# Patient Record
Sex: Female | Born: 1965 | Race: White | Hispanic: No | Marital: Married | Smoking: Former smoker
Health system: Southern US, Community
[De-identification: ages and names within clinical notes are randomized; demographics above are authoritative.]

## PROBLEM LIST (undated history)

## (undated) DIAGNOSIS — E559 Vitamin D deficiency, unspecified: Secondary | ICD-10-CM

## (undated) DIAGNOSIS — R7303 Prediabetes: Secondary | ICD-10-CM

## (undated) DIAGNOSIS — R252 Cramp and spasm: Secondary | ICD-10-CM

## (undated) DIAGNOSIS — K760 Fatty (change of) liver, not elsewhere classified: Secondary | ICD-10-CM

## (undated) DIAGNOSIS — E785 Hyperlipidemia, unspecified: Secondary | ICD-10-CM

## (undated) DIAGNOSIS — R7309 Other abnormal glucose: Secondary | ICD-10-CM

## (undated) DIAGNOSIS — H9190 Unspecified hearing loss, unspecified ear: Secondary | ICD-10-CM

## (undated) HISTORY — DX: Vitamin D deficiency, unspecified: E55.9

## (undated) HISTORY — DX: Unspecified hearing loss, unspecified ear: H91.90

## (undated) HISTORY — DX: Fatty (change of) liver, not elsewhere classified: K76.0

## (undated) HISTORY — PX: TUBAL LIGATION: SHX77

## (undated) HISTORY — DX: Cramp and spasm: R25.2

## (undated) HISTORY — DX: Prediabetes: R73.03

## (undated) HISTORY — DX: Other abnormal glucose: R73.09

## (undated) HISTORY — DX: Hyperlipidemia, unspecified: E78.5

---

## 2014-10-21 ENCOUNTER — Ambulatory Visit: Payer: Self-pay | Admitting: Physician Assistant

## 2014-11-10 ENCOUNTER — Ambulatory Visit: Payer: Self-pay | Admitting: Physician Assistant

## 2015-02-07 ENCOUNTER — Ambulatory Visit: Payer: Self-pay | Admitting: Gastroenterology

## 2015-04-04 LAB — SURGICAL PATHOLOGY

## 2015-05-16 IMAGING — MG MM DIGITAL SCREENING BILAT W/ CAD
4 series · 4 of 4 positions shown · non-contrast
Comparison: None.

CLINICAL DATA: Screening.

EXAM:
DIGITAL SCREENING BILATERAL MAMMOGRAM WITH CAD

[R MLO]
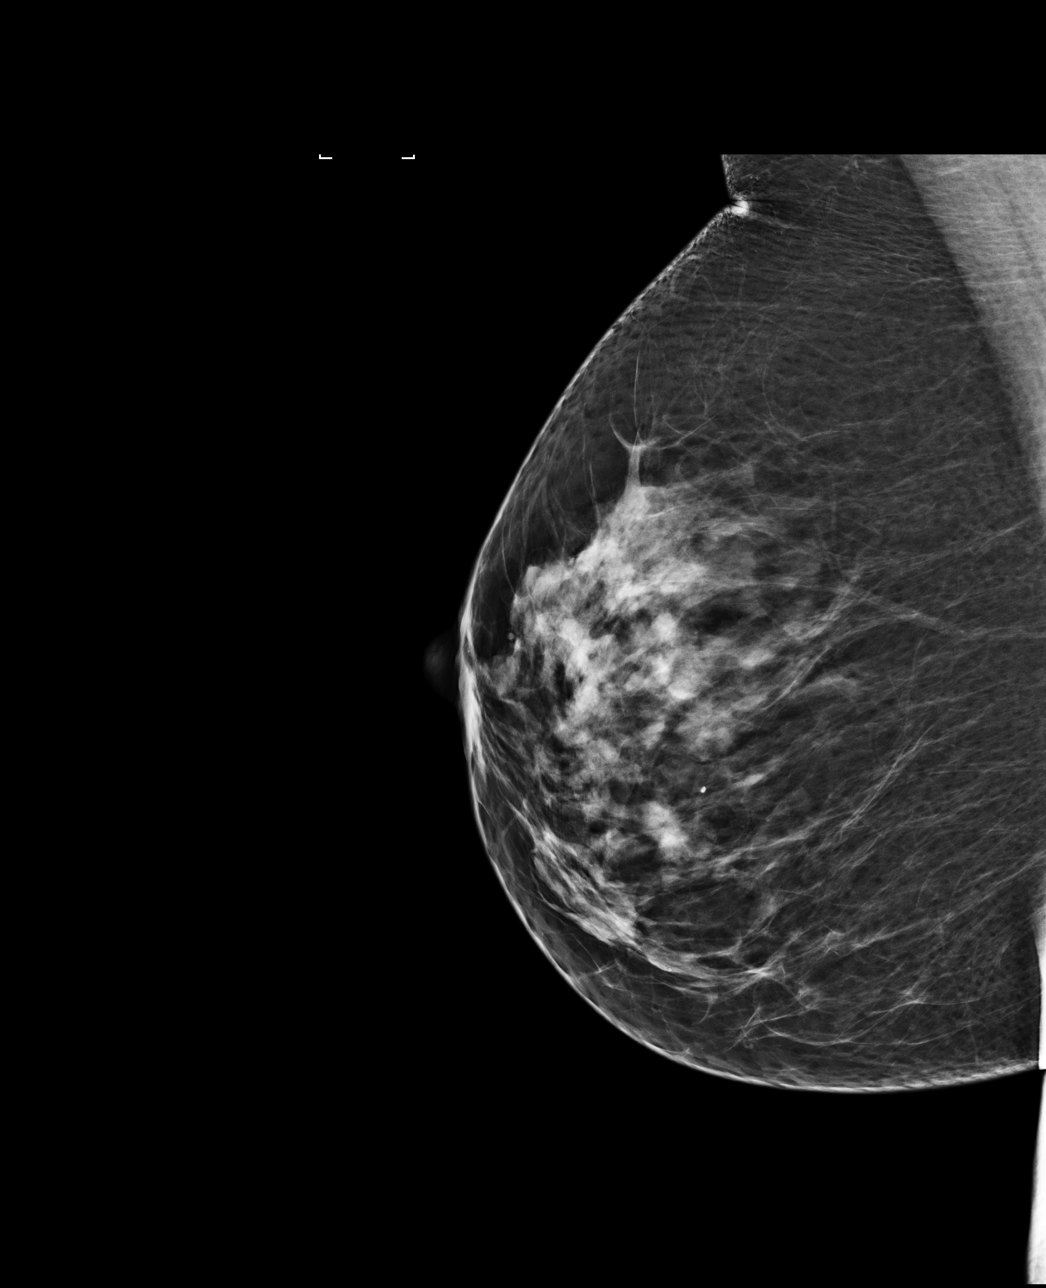

[L MLO]
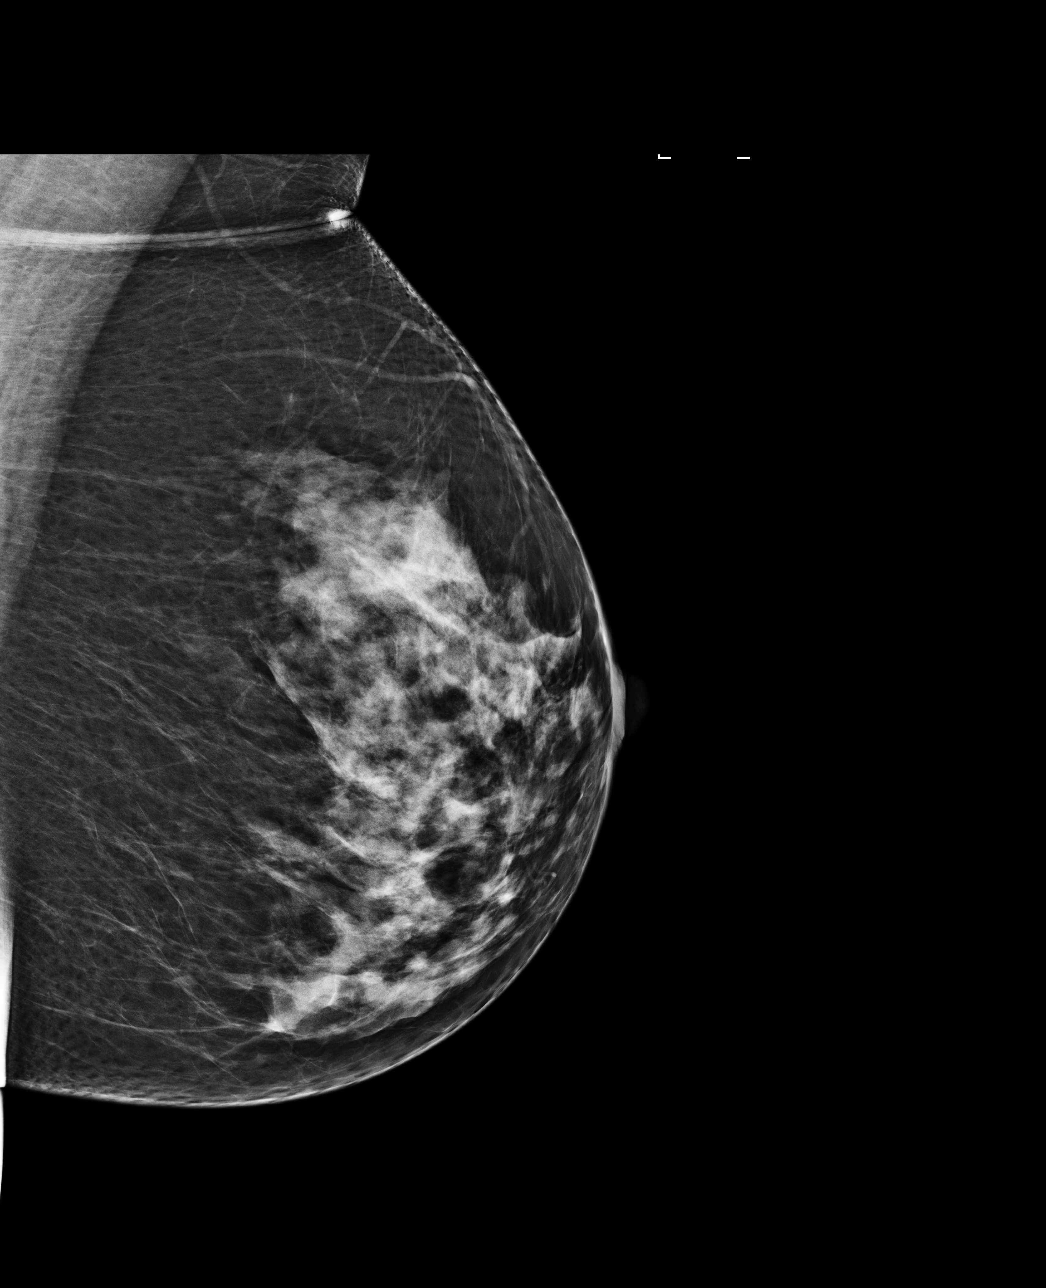

[R CC]
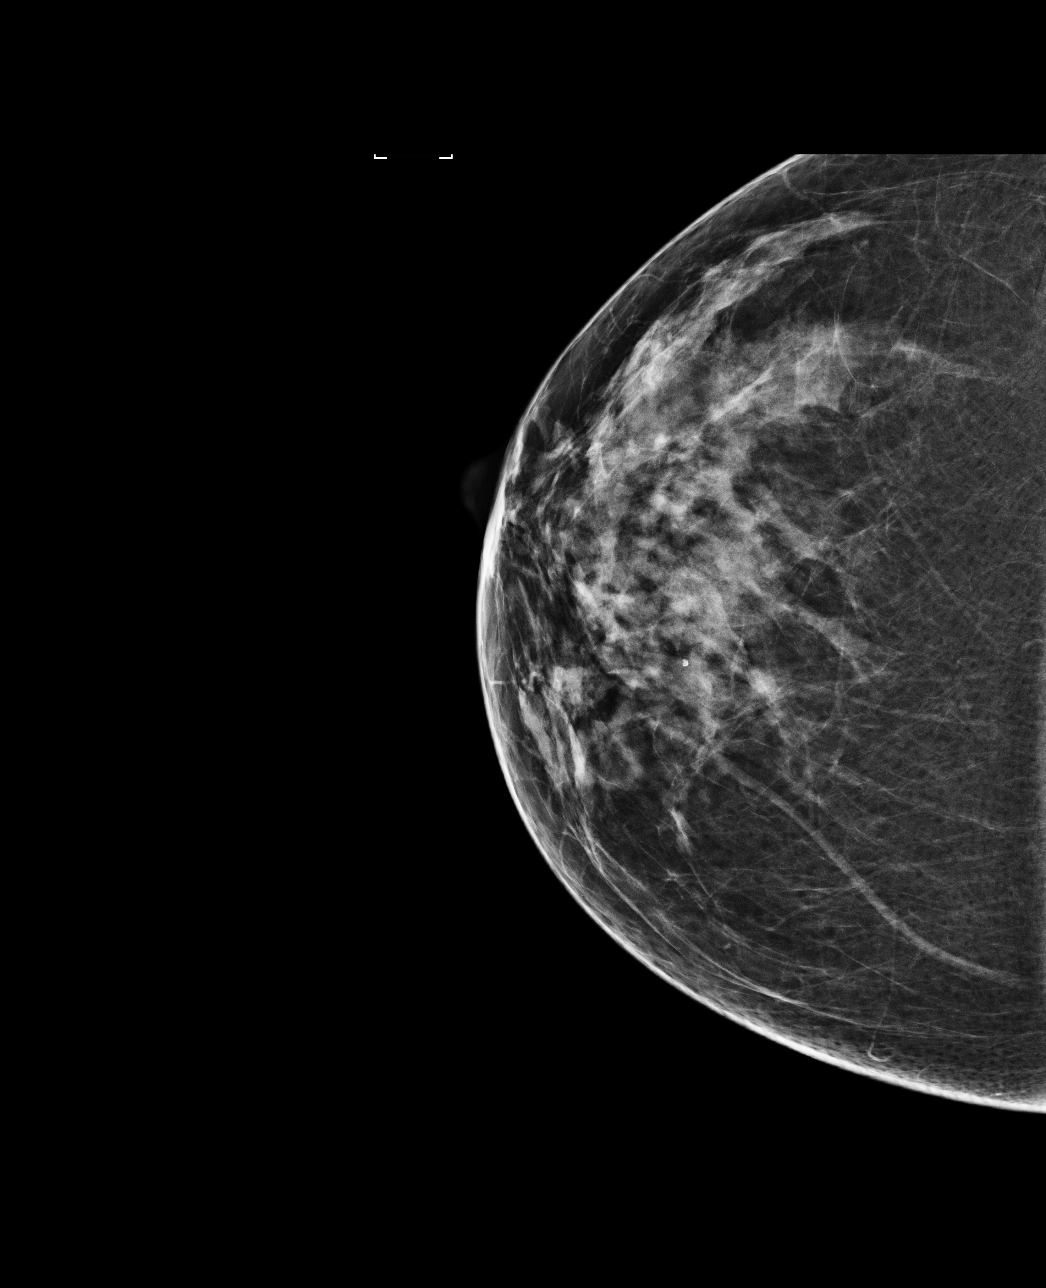

[L CC]
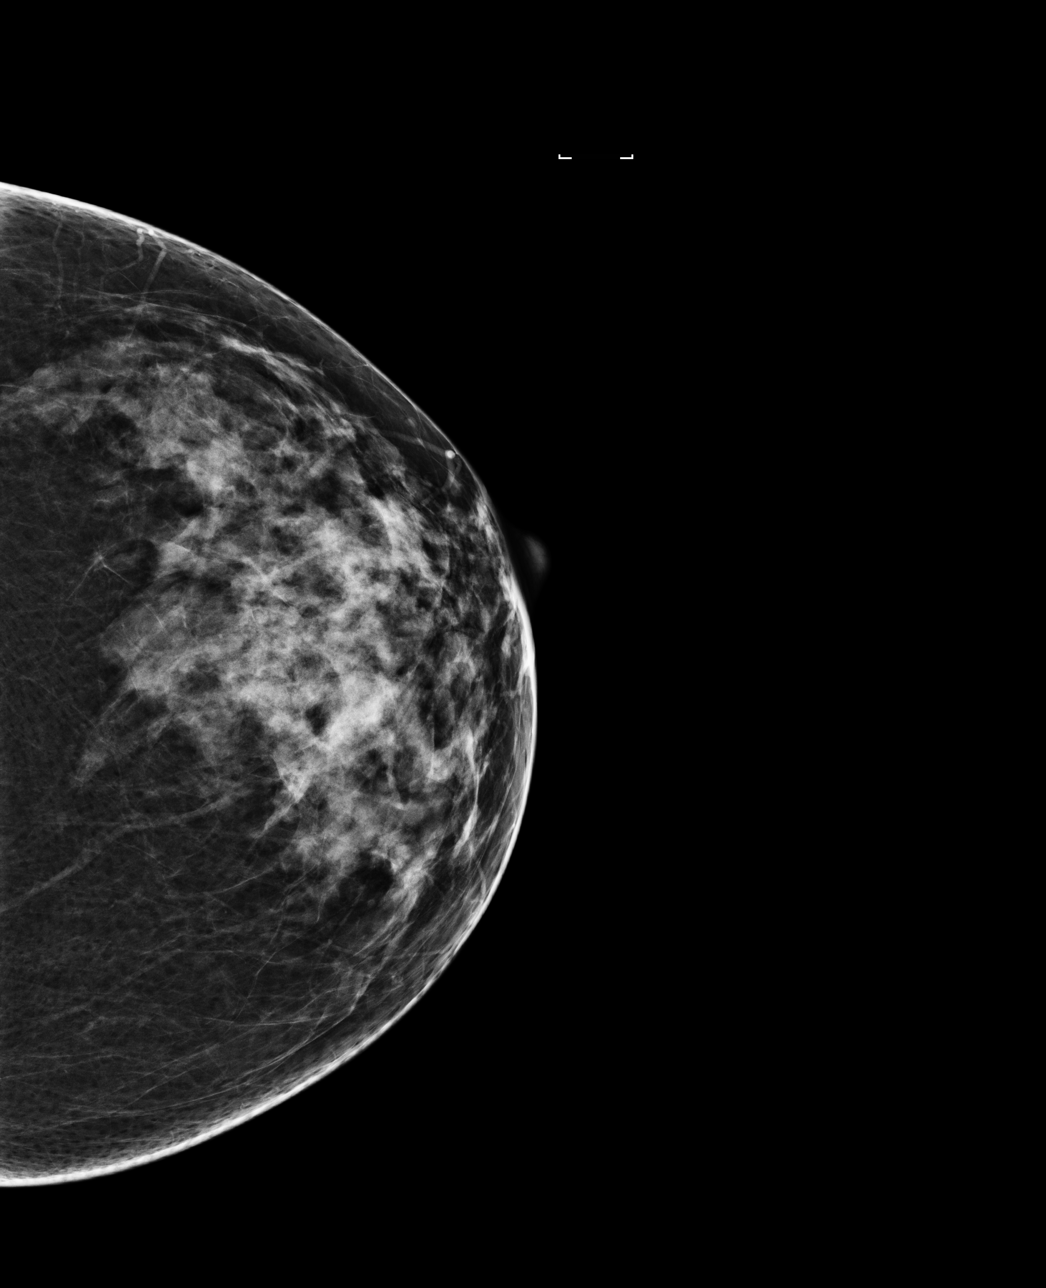

[4 of 4 positions shown; findings below may reference images not displayed]

ACR Breast Density Category c: The breast tissue is heterogeneously
dense, which may obscure small masses
FINDINGS: There are no findings suspicious for malignancy. Images were
processed with CAD.
IMPRESSION: No mammographic evidence of malignancy. A result letter of this
screening mammogram will be mailed directly to the patient.

RECOMMENDATION:
Screening mammogram in one year. (Code:[id number])

BI-RADS CATEGORY  1: Negative.

## 2016-01-18 ENCOUNTER — Other Ambulatory Visit: Payer: Self-pay | Admitting: Physician Assistant

## 2016-01-18 DIAGNOSIS — Z1231 Encounter for screening mammogram for malignant neoplasm of breast: Secondary | ICD-10-CM

## 2016-01-25 ENCOUNTER — Encounter: Payer: Self-pay | Admitting: Dietician

## 2016-01-25 ENCOUNTER — Encounter: Payer: 59 | Attending: Physician Assistant | Admitting: Dietician

## 2016-01-25 VITALS — Ht 67.0 in | Wt 207.4 lb

## 2016-01-25 DIAGNOSIS — E119 Type 2 diabetes mellitus without complications: Secondary | ICD-10-CM | POA: Insufficient documentation

## 2016-01-25 NOTE — Progress Notes (Signed)
Diabetes Self-Management Education  Visit Type: First/Initial  Appt. Start Time: 8:15  End Time: 9:15am  01/25/2016  [name], identified by name and date of birth, is a 50 y.o. female with a diagnosis of Diabetes: Type 2.   ASSESSMENT  Height  (1.702 m), weight 207 lb 6.4 oz (94.076 kg). Body mass index is 32.48 kg/(m^2).      Diabetes Self-Management Education - 01/25/16 0936    Visit Information   Visit Type First/Initial   Initial Visit   Diabetes Type Type 2   Are you currently following a meal plan? No   Are you taking your medications as prescribed? No  Is not taking statin medication as prescribed; is not on medication for diabetes   Health Coping   How would you rate your overall health? Good   Psychosocial Assessment   Patient Belief/Attitude about Diabetes Motivated to manage diabetes  "not good"   Self-care barriers None   Other persons present Patient   Patient Concerns Glycemic Control;Weight Control;Healthy Lifestyle   Special Needs None   Preferred Learning Style Hands on   Learning Readiness Ready   How often do you need to have someone help you when you read instructions, pamphlets, or other written materials from your doctor or pharmacy? 1 - Never   What is the last grade level you completed in school? high school   Complications   Last HgB A1C per patient/outside source 6.6 %   How often do you check your blood sugar? 0 times/day (not testing)  Gave and instructed on One touch meter;  134 (8:45am , 1 hr pp)   Have you had a dilated eye exam in the past 12 months? No   Have you had a dental exam in the past 12 months? Yes   Are you checking your feet? No   Dietary Intake   Breakfast --  Is drinking protein shake -recent change; previously skipping breakfast; eats lunch and dinner   Snack (morning) --  Eats 3-4 snacks per day   Exercise   Exercise Type ADL's   Patient Education   Previous Diabetes Education Yes (please comment)  had  education for gestational diabetes   Disease state  Definition of diabetes, type 1 and 2, and the diagnosis of diabetes   Nutrition management  Role of diet in the treatment of diabetes and the relationship between the three main macronutrients and blood glucose level   Physical activity and exercise  Role of exercise on diabetes management, blood pressure control and cardiac health.   Monitoring Taught/evaluated SMBG meter.;Taught/discussed recording of test results and interpretation of SMBG.;Identified appropriate SMBG and/or A1C goals.;Purpose and frequency of SMBG.   Personal strategies to promote health Lifestyle issues that need to be addressed for better diabetes care   Individualized Goals (developed by patient)   Reducing Risk Other (comment)  Improve blood sugars, lose weight, lead a healthier lifestyle      Individualized Plan for Diabetes Self-Management Training:   Learning Objective:  Patient will have a greater understanding of diabetes self-management. Patient education plan is to attend individual and/or group sessions per assessed needs and concerns.   Plan:   Patient Instructions  Check blood sugars 2 x day before breakfast and 2 hrs after supper every day  Eat 3 meals day,   2-3  snacks a day  Space meals 4-6 hours apart  Bring blood sugar records to the next appointment/class  Call your doctor for a prescription for:  1. Meter strips (type)  One Touch mini checking  2 times per day  2. Lancets (type)  One Touch Delica checking  2   times per day   Return for appointment/classes on:  01/30/2016 5:30 class      02/06/2016      02/13/2016    Education material provided: General Nutrition Guidelines for Diabetes; Simple meal planning, One touch meter If problems or questions, patient to contact team via:  Darrel Reach, RD,CDE at [phone] Future DSME appointment:  01/30/16 5:30pm

## 2016-01-25 NOTE — Patient Instructions (Signed)
Check blood sugars 2 x day before breakfast and 2 hrs after supper every day  Eat 3 meals day,   2-3  snacks a day  Space meals 4-6 hours apart  Bring blood sugar records to the next appointment/class  Call your doctor for a prescription for:  1. Meter strips (type)  One Touch mini checking  2 times per day  2. Lancets (type)  One Touch Delica checking  2   times per day   Return for appointment/classes on:  01/30/2016 5:30 class      02/06/2016      02/13/2016

## 2016-01-27 ENCOUNTER — Ambulatory Visit: Payer: Self-pay

## 2016-01-30 ENCOUNTER — Encounter: Payer: Self-pay | Admitting: Dietician

## 2016-01-30 ENCOUNTER — Encounter: Payer: 59 | Admitting: Dietician

## 2016-01-30 VITALS — Ht 67.0 in | Wt 204.9 lb

## 2016-01-30 DIAGNOSIS — E119 Type 2 diabetes mellitus without complications: Secondary | ICD-10-CM | POA: Diagnosis not present

## 2016-01-30 NOTE — Progress Notes (Signed)

## 2016-02-06 ENCOUNTER — Encounter: Payer: 59 | Admitting: Dietician

## 2016-02-06 VITALS — Wt 203.4 lb

## 2016-02-06 DIAGNOSIS — E119 Type 2 diabetes mellitus without complications: Secondary | ICD-10-CM | POA: Diagnosis not present

## 2016-02-06 NOTE — Progress Notes (Signed)

## 2016-02-08 ENCOUNTER — Ambulatory Visit
Admission: RE | Admit: 2016-02-08 | Discharge: 2016-02-08 | Disposition: A | Discharge status: Home / Self Care | Payer: 59 | Source: Ambulatory Visit | Attending: Physician Assistant | Admitting: Physician Assistant

## 2016-02-08 DIAGNOSIS — Z1231 Encounter for screening mammogram for malignant neoplasm of breast: Secondary | ICD-10-CM

## 2016-02-13 ENCOUNTER — Encounter: Payer: 59 | Attending: Physician Assistant | Admitting: Dietician

## 2016-02-13 ENCOUNTER — Encounter: Payer: Self-pay | Admitting: Dietician

## 2016-02-13 VITALS — BP 110/76 | Ht 67.0 in | Wt 201.7 lb

## 2016-02-13 DIAGNOSIS — E119 Type 2 diabetes mellitus without complications: Secondary | ICD-10-CM | POA: Insufficient documentation

## 2016-02-13 NOTE — Progress Notes (Signed)

## 2016-02-28 ENCOUNTER — Encounter: Payer: Self-pay | Admitting: Dietician

## 2017-01-23 ENCOUNTER — Other Ambulatory Visit: Payer: Self-pay | Admitting: Physician Assistant

## 2017-01-23 DIAGNOSIS — R7989 Other specified abnormal findings of blood chemistry: Secondary | ICD-10-CM | POA: Diagnosis not present

## 2017-01-23 DIAGNOSIS — E119 Type 2 diabetes mellitus without complications: Secondary | ICD-10-CM | POA: Diagnosis not present

## 2017-01-23 DIAGNOSIS — Z0000 Encounter for general adult medical examination without abnormal findings: Secondary | ICD-10-CM | POA: Diagnosis not present

## 2017-01-23 DIAGNOSIS — Z1231 Encounter for screening mammogram for malignant neoplasm of breast: Secondary | ICD-10-CM

## 2017-02-14 DIAGNOSIS — H903 Sensorineural hearing loss, bilateral: Secondary | ICD-10-CM | POA: Diagnosis not present

## 2017-02-26 ENCOUNTER — Ambulatory Visit
Admission: RE | Admit: 2017-02-26 | Discharge: 2017-02-26 | Disposition: A | Discharge status: Home / Self Care | Payer: 59 | Source: Ambulatory Visit | Attending: Physician Assistant | Admitting: Physician Assistant

## 2017-02-26 DIAGNOSIS — Z1231 Encounter for screening mammogram for malignant neoplasm of breast: Secondary | ICD-10-CM | POA: Diagnosis not present

## 2017-07-11 DIAGNOSIS — E119 Type 2 diabetes mellitus without complications: Secondary | ICD-10-CM | POA: Diagnosis not present

## 2017-07-11 DIAGNOSIS — E782 Mixed hyperlipidemia: Secondary | ICD-10-CM | POA: Diagnosis not present

## 2017-07-24 DIAGNOSIS — H1089 Other conjunctivitis: Secondary | ICD-10-CM | POA: Diagnosis not present

## 2017-11-27 DIAGNOSIS — J069 Acute upper respiratory infection, unspecified: Secondary | ICD-10-CM | POA: Diagnosis not present

## 2017-12-16 DIAGNOSIS — J019 Acute sinusitis, unspecified: Secondary | ICD-10-CM | POA: Diagnosis not present

## 2018-02-03 ENCOUNTER — Other Ambulatory Visit: Payer: Self-pay | Admitting: Physician Assistant

## 2018-02-03 DIAGNOSIS — Z0000 Encounter for general adult medical examination without abnormal findings: Secondary | ICD-10-CM | POA: Diagnosis not present

## 2018-02-03 DIAGNOSIS — E119 Type 2 diabetes mellitus without complications: Secondary | ICD-10-CM | POA: Diagnosis not present

## 2018-02-03 DIAGNOSIS — E782 Mixed hyperlipidemia: Secondary | ICD-10-CM | POA: Diagnosis not present

## 2018-02-03 DIAGNOSIS — Z1231 Encounter for screening mammogram for malignant neoplasm of breast: Secondary | ICD-10-CM

## 2018-02-27 ENCOUNTER — Ambulatory Visit
Admission: RE | Admit: 2018-02-27 | Discharge: 2018-02-27 | Disposition: A | Discharge status: Home / Self Care | Payer: 59 | Source: Ambulatory Visit | Attending: Physician Assistant | Admitting: Physician Assistant

## 2018-02-27 DIAGNOSIS — Z1231 Encounter for screening mammogram for malignant neoplasm of breast: Secondary | ICD-10-CM

## 2018-08-04 DIAGNOSIS — E782 Mixed hyperlipidemia: Secondary | ICD-10-CM | POA: Diagnosis not present

## 2018-08-04 DIAGNOSIS — E119 Type 2 diabetes mellitus without complications: Secondary | ICD-10-CM | POA: Diagnosis not present

## 2018-10-06 DIAGNOSIS — Z23 Encounter for immunization: Secondary | ICD-10-CM | POA: Diagnosis not present

## 2019-02-02 ENCOUNTER — Other Ambulatory Visit: Payer: Self-pay | Admitting: Physician Assistant

## 2019-02-02 DIAGNOSIS — Z1231 Encounter for screening mammogram for malignant neoplasm of breast: Secondary | ICD-10-CM

## 2019-02-05 ENCOUNTER — Encounter (INDEPENDENT_AMBULATORY_CARE_PROVIDER_SITE_OTHER): Payer: Self-pay

## 2019-02-12 ENCOUNTER — Ambulatory Visit (INDEPENDENT_AMBULATORY_CARE_PROVIDER_SITE_OTHER): Payer: 59 | Admitting: Family Medicine

## 2019-02-12 ENCOUNTER — Encounter (INDEPENDENT_AMBULATORY_CARE_PROVIDER_SITE_OTHER): Payer: Self-pay | Admitting: Family Medicine

## 2019-02-12 VITALS — BP 116/76 | HR 84 | Temp 98.1°F | Ht 67.0 in | Wt 213.0 lb

## 2019-02-12 DIAGNOSIS — R0602 Shortness of breath: Secondary | ICD-10-CM

## 2019-02-12 DIAGNOSIS — Z0289 Encounter for other administrative examinations: Secondary | ICD-10-CM

## 2019-02-12 DIAGNOSIS — Z1331 Encounter for screening for depression: Secondary | ICD-10-CM | POA: Diagnosis not present

## 2019-02-12 DIAGNOSIS — E7849 Other hyperlipidemia: Secondary | ICD-10-CM

## 2019-02-12 DIAGNOSIS — Z9189 Other specified personal risk factors, not elsewhere classified: Secondary | ICD-10-CM

## 2019-02-12 DIAGNOSIS — Z6833 Body mass index (BMI) 33.0-33.9, adult: Secondary | ICD-10-CM

## 2019-02-12 DIAGNOSIS — E559 Vitamin D deficiency, unspecified: Secondary | ICD-10-CM

## 2019-02-12 DIAGNOSIS — R5383 Other fatigue: Secondary | ICD-10-CM | POA: Diagnosis not present

## 2019-02-12 DIAGNOSIS — E1165 Type 2 diabetes mellitus with hyperglycemia: Secondary | ICD-10-CM | POA: Diagnosis not present

## 2019-02-12 DIAGNOSIS — E669 Obesity, unspecified: Secondary | ICD-10-CM

## 2019-02-14 NOTE — Progress Notes (Signed)
.  Office: [phone]  /  Fax: [phone]   HPI:   Chief Complaint: OBESITY  Jamie Riley (MR# [id number]) is a 53 y.o. female who presents on 02/12/2019 for obesity evaluation and treatment. Current BMI is Body mass index is 33.36 kg/m.Marland Kitchen Jamie Riley has struggled with obesity for years and has been unsuccessful in either losing weight or maintaining long term weight loss. Jamie Riley attended our information session and states she is currently in the action stage of change and ready to dedicate time achieving and maintaining a healthier weight.   Jamie Riley heard about our clinic in from her co-workers.  Jamie Riley states her family eats meals together she thinks her family will eat healthier with  her her desired weight loss is 53 lbs she started gaining weight 5 years ago her heaviest weight ever was 218 lbs she snacks frequently in the evenings she skips meals frequently she frequently eats larger portions than normal  she struggles with emotional eating    Fatigue Jamie Riley feels her energy is lower than it should be. This has worsened with weight gain and has not worsened recently. Jamie Riley admits to daytime somnolence and  admits to waking up still tired. Patient is at risk for obstructive sleep apnea. Patent has a history of symptoms of daytime fatigue. Patient generally gets 5 hours of sleep per night, and states they generally have generally restful sleep. Snoring is present. Apneic episodes are not present. Epworth Sleepiness Score is 3.  Dyspnea on exertion Jamie Riley notes increasing shortness of breath with exercising and seems to be worsening over time with weight gain. She notes getting out of breath sooner with activity than she used to. This has not gotten worse recently. EKG-normal sinus rhythm at 83 BPM. Jamie Riley denies orthopnea.  Diabetes II with Hyperglycemia Jamie Riley has a diagnosis of diabetes type II. Jamie Riley's Hgb A1c was 6.6 last week. She is not on medications and denies hypoglycemia. She has been  working on intensive lifestyle modifications including diet, exercise, and weight loss to help control her blood glucose levels.  Hyperlipidemia Jamie Riley has hyperlipidemia and has been trying to improve her cholesterol levels with intensive lifestyle modification including a low saturated fat diet, exercise and weight loss. Her LDL is elevated and she was recently prescribed Lipitor. She denies any chest pain, claudication or myalgias.  Vitamin D Deficiency Jamie Riley has a diagnosis of vitamin D deficiency. She is not on OTC Vit D, just started on 50,000 IU weekly (not started yet). She denies nausea, vomiting or muscle weakness.  At risk for osteopenia and osteoporosis Jamie Riley is at higher risk of osteopenia and osteoporosis due to vitamin D deficiency.   Depression Screen Jamie Riley's Food and Mood (modified PHQ-9) score was  Depression screen PHQ 2/9 02/12/2019  Decreased Interest 1  Down, Depressed, Hopeless 0  PHQ - 2 Score 1  Altered sleeping 3  Tired, decreased energy 3  Change in appetite 0  Feeling bad or failure about yourself  0  Trouble concentrating 0  Moving slowly or fidgety/restless 0  Suicidal thoughts 0  PHQ-9 Score 7  Difficult doing work/chores Not difficult at all    ASSESSMENT AND PLAN:  Other fatigue - Plan: EKG 12-Lead, Vitamin B12, Folate  Shortness of breath on exertion  Type 2 diabetes mellitus with hyperglycemia, without long-term current use of insulin (HCC) - Plan: Insulin, random  Other hyperlipidemia  Vitamin D deficiency  Depression screening  At risk for osteoporosis  Class 1 obesity with serious comorbidity and body mass  index (BMI) of 33.0 to 33.9 in adult, unspecified obesity type  PLAN:  Fatigue Jamie Riley was informed that her fatigue may be related to obesity, depression or many other causes. Labs will be ordered, and in the meanwhile Jamie Riley has agreed to work on diet, exercise and weight loss to help with fatigue. Proper sleep hygiene was discussed  including the need for 7-8 hours of quality sleep each night. A sleep study was not ordered based on symptoms and Epworth score.  Dyspnea on exertion Jamie Riley's shortness of breath appears to be obesity related and exercise induced. She has agreed to work on weight loss and gradually increase exercise to treat her exercise induced shortness of breath. If Jamie Riley follows our instructions and loses weight without improvement of her shortness of breath, we will plan to refer to pulmonology. We will monitor this condition regularly. Jamie Riley agrees to this plan.  Diabetes II with Hyperglycemia Jamie Riley has been given extensive diabetes education by myself today including ideal fasting and post-prandial blood glucose readings, individual ideal Hgb A1c goals and hypoglycemia prevention. We discussed the importance of good blood sugar control to decrease the likelihood of diabetic complications such as nephropathy, neuropathy, limb loss, blindness, coronary artery disease, and death. We discussed the importance of intensive lifestyle modification including diet, exercise and weight loss as the first line treatment for diabetes. We will check insulin level and B12 level today. Jamie Riley agrees to follow up with our clinic in 2 weeks.  Hyperlipidemia Jamie Riley was informed of the American Heart Association Guidelines emphasizing intensive lifestyle modifications as the first line treatment for hyperlipidemia. We discussed many lifestyle modifications today in depth, and Jamie Riley will continue to work on decreasing saturated fats such as fatty red meat, butter and many fried foods. She will also increase vegetables and lean protein in her diet and continue to work on exercise and weight loss efforts. We will follow up on labs in 3 months. Jamie Riley agrees to follow up with our clinic in 2 weeks.  Vitamin D Deficiency Jamie Riley was informed that low vitamin D levels contributes to fatigue and are associated with obesity, breast, and colon  cancer. Jamie Riley agrees to start prescription strength Vit D 50,000 IU every week and will follow up for routine testing of vitamin D, at least 2-3 times per year. She was informed of the risk of over-replacement of vitamin D and agrees to not increase her dose unless she discusses this with [place] first. Jamie Riley agrees to follow up with our clinic in 2 weeks.  At risk for osteopenia and osteoporosis Jamie Riley was given extended (15 minutes) osteoporosis prevention counseling today. Jamie Riley is at risk for osteopenia and osteoporsis due to her vitamin D deficiency. She was encouraged to take her vitamin D and follow her higher calcium diet and increase strengthening exercise to help strengthen her bones and decrease her risk of osteopenia and osteoporosis.  Depression Screen Jamie Riley had a mildly positive depression screening. Depression is commonly associated with obesity and often results in emotional eating behaviors. We will monitor this closely and work on CBT to help improve the non-hunger eating patterns. Referral to Psychology may be required if no improvement is seen as she continues in our clinic.  Obesity Jamie Riley is currently in the action stage of change and her goal is to continue with weight loss efforts She has agreed to follow the Category 3 plan with additional lunch options Jamie Riley has been instructed to work up to a goal of 150 minutes of combined cardio  and strengthening exercise per week for weight loss and overall health benefits. We discussed the following Behavioral Modification Strategies today: increasing lean protein intake, increasing vegetables, work on meal planning and easy cooking plans, better snacking choices, and planning for success  Daanya has agreed to follow up with our clinic in 2 weeks. She was informed of the importance of frequent follow up visits to maximize her success with intensive lifestyle modifications for her multiple health conditions. She was informed we would discuss her  lab results at her next visit unless there is a critical issue that needs to be addressed sooner. Jamie Riley agreed to keep her next visit at the agreed upon time to discuss these results.  ALLERGIES: No Known Allergies  MEDICATIONS: Current Outpatient Medications on File Prior to Visit  Medication Sig Dispense Refill  . ergocalciferol (VITAMIN D2) 1.25 MG (50000 UT) capsule Take 50,000 Units by mouth once a week.     No current facility-administered medications on file prior to visit.     PAST MEDICAL HISTORY: Past Medical History:  Diagnosis Date  . Decreased hearing   . Elevated hemoglobin A1c measurement   . Fatty liver   . Hyperlipidemia   . Leg cramps   . Prediabetes   . Vitamin D deficiency     PAST SURGICAL HISTORY: Past Surgical History:  Procedure Laterality Date  . TUBAL LIGATION      SOCIAL HISTORY: Social History   Tobacco Use  . Smoking status: Former Games developer  . Smokeless tobacco: Never Used  Substance Use Topics  . Alcohol use: No    Alcohol/week: 0.0 standard drinks  . Drug use: Not on file    FAMILY HISTORY: Family History  Problem Relation Age of Onset  . Breast cancer Cousin 48  . Diabetes Mother   . Hypertension Father   . Heart disease Father     ROS: Review of Systems  Constitutional: Positive for malaise/fatigue. Negative for weight loss.  HENT:       + Decreased hearing  Respiratory: Positive for shortness of breath (with exertion).   Cardiovascular: Negative for chest pain, orthopnea and claudication.       + Leg cramping  Gastrointestinal: Negative for nausea and vomiting.  Musculoskeletal: Negative for myalgias.       Negative muscle weakness  Endo/Heme/Allergies:       Negative hypoglycemia    PHYSICAL EXAM: Blood pressure 116/76, pulse 84, temperature 98.1 F (36.7 C), temperature source Oral, height  (1.702 m), weight 213 lb (96.6 kg), SpO2 95 %. Body mass index is 33.36 kg/m. Physical Exam Vitals signs reviewed.    Constitutional:      Appearance: Normal appearance. She is obese.  HENT:     Head: Normocephalic and atraumatic.     Nose: Nose normal.  Eyes:     General: No scleral icterus.    Extraocular Movements: Extraocular movements intact.  Neck:     Musculoskeletal: Normal range of motion and neck supple.     Comments: No thyromegaly present Cardiovascular:     Rate and Rhythm: Normal rate and regular rhythm.     Pulses: Normal pulses.     Heart sounds: Normal heart sounds.  Pulmonary:     Effort: Pulmonary effort is normal. No respiratory distress.     Breath sounds: Normal breath sounds.  Abdominal:     Palpations: Abdomen is soft.     Tenderness: There is no abdominal tenderness.     Comments: + Obesity  Musculoskeletal: Normal range of motion.     Right lower leg: No edema.     Left lower leg: No edema.  Skin:    General: Skin is warm and dry.  Neurological:     Mental Status: She is alert and oriented to person, place, and time.     Coordination: Coordination normal.  Psychiatric:        Mood and Affect: Mood normal.        Behavior: Behavior normal.     RECENT LABS AND TESTS: BMET No results found for: NA, K, CL, CO2, GLUCOSE, BUN, CREATININE, CALCIUM, GFRNONAA, GFRAA No results found for: HGBA1C No results found for: INSULIN CBC No results found for: WBC, RBC, HGB, HCT, PLT, MCV, MCH, MCHC, RDW, LYMPHSABS, MONOABS, EOSABS, BASOSABS Iron/TIBC/Ferritin/ %Sat No results found for: IRON, TIBC, FERRITIN, IRONPCTSAT Lipid Panel  No results found for: CHOL, TRIG, HDL, CHOLHDL, VLDL, LDLCALC, LDLDIRECT Hepatic Function Panel  No results found for: PROT, ALBUMIN, AST, ALT, ALKPHOS, BILITOT, BILIDIR, IBILI No results found for: TSH   ECG  shows NSR with a rate of 83 BPM INDIRECT CALORIMETER done today shows a VO2 of 297 and a REE of 2067. Her calculated basal metabolic rate is 0981 thus her basal metabolic rate is better than expected.       OBESITY BEHAVIORAL  INTERVENTION VISIT  Today's visit was # 1   Starting weight: 213 lbs Starting date: 02/12/2019 Today's weight : 213 lbs  Today's date: 02/12/2019 Total lbs lost to date: 0    02/12/2019  Height  (1.702 m)  Weight 213 lb (96.6 kg)  BMI (Calculated) 33.35  BLOOD PRESSURE - SYSTOLIC 116  BLOOD PRESSURE - DIASTOLIC 76  Waist Measurement  49 inches   Body Fat % 42.4 %  Total Body Water (lbs) 73.4 lbs  RMR 2067     ASK: We discussed the diagnosis of obesity with Jamie Riley today and Quinlyn agreed to give [place] permission to discuss obesity behavioral modification therapy today.  ASSESS: Arthella has the diagnosis of obesity and her BMI today is 33.35 Tanea is in the action stage of change   ADVISE: Shawntia was educated on the multiple health risks of obesity as well as the benefit of weight loss to improve her health. She was advised of the need for long term treatment and the importance of lifestyle modifications to improve her current health and to decrease her risk of future health problems.  AGREE: Multiple dietary modification options and treatment options were discussed and  Nayellie agreed to follow the recommendations documented in the above note.  ARRANGE: Payson was educated on the importance of frequent visits to treat obesity as outlined per CMS and USPSTF guidelines and agreed to schedule her next follow up appointment today.   I, Burt Knack, am acting as transcriptionist for Debbra Riding, MD   I have reviewed the above documentation for accuracy and completeness, and I agree with the above. - Debbra Riding, MD

## 2019-02-17 LAB — FOLATE: Folate: 7.7 ng/mL (ref >3.0)

## 2019-02-17 LAB — VITAMIN B12: Vitamin B-12: 383 pg/mL (ref 232–1245)

## 2019-02-17 LAB — INSULIN, RANDOM: INSULIN: 34.5 u[IU]/mL — ABNORMAL HIGH (ref 2.6–24.9)

## 2019-02-19 ENCOUNTER — Encounter (INDEPENDENT_AMBULATORY_CARE_PROVIDER_SITE_OTHER): Payer: Self-pay | Admitting: Family Medicine

## 2019-02-26 ENCOUNTER — Encounter (INDEPENDENT_AMBULATORY_CARE_PROVIDER_SITE_OTHER): Payer: Self-pay | Admitting: Family Medicine

## 2019-02-26 ENCOUNTER — Other Ambulatory Visit: Payer: Self-pay

## 2019-02-26 ENCOUNTER — Encounter (INDEPENDENT_AMBULATORY_CARE_PROVIDER_SITE_OTHER): Payer: Self-pay

## 2019-02-26 ENCOUNTER — Ambulatory Visit (INDEPENDENT_AMBULATORY_CARE_PROVIDER_SITE_OTHER): Payer: 59 | Admitting: Family Medicine

## 2019-02-26 VITALS — BP 111/74 | HR 74 | Temp 97.8°F | Ht 67.0 in | Wt 208.0 lb

## 2019-02-26 DIAGNOSIS — E559 Vitamin D deficiency, unspecified: Secondary | ICD-10-CM

## 2019-02-26 DIAGNOSIS — Z6832 Body mass index (BMI) 32.0-32.9, adult: Secondary | ICD-10-CM

## 2019-02-26 DIAGNOSIS — Z9189 Other specified personal risk factors, not elsewhere classified: Secondary | ICD-10-CM

## 2019-02-26 DIAGNOSIS — E669 Obesity, unspecified: Secondary | ICD-10-CM | POA: Diagnosis not present

## 2019-02-26 DIAGNOSIS — E1165 Type 2 diabetes mellitus with hyperglycemia: Secondary | ICD-10-CM | POA: Diagnosis not present

## 2019-02-26 MED ORDER — METFORMIN HCL 500 MG PO TABS: 500.0000 mg | ORAL_TABLET | Freq: Every day | ORAL | 0 refills | Status: DC

## 2019-02-26 NOTE — Progress Notes (Signed)
Office: [phone]  /  Fax: [phone]   HPI:   Chief Complaint: OBESITY Jamie Riley is here to discuss her progress with her obesity treatment plan. She is on the Category 3 plan with additional lunch options and is following her eating plan approximately 99 % of the time. She states she is exercising 0 minutes 0 times per week. Jamie Riley started eating yogurt and she has never liked yogurt prior to this. Jamie Riley is not eating all of the food for each meal. She is normally getting approximately 6 to 8 ounces of meat at dinner with 2 cups of vegetables. She wants to use her snack calories, to get potatoes. Her weight is 208 lb (94.3 kg) today and has had a weight loss of 5 pounds over a period of 2 weeks since her last visit. She has lost 5 lbs since starting treatment with [place].  Diabetes II Jamie Riley has a diagnosis of diabetes type II. Jamie Riley is not on medications currently. Her last A1c was at 6.6 and her last insulin level was at 34.5 She has been working on intensive lifestyle modifications including diet, exercise, and weight loss to help control her blood glucose levels.  Vitamin D deficiency Jamie Riley has a diagnosis of vitamin D deficiency. She was previously prescribed Vitamin D 50,000 IU weekly and she is on week 3 of high dose vitamin D. Jamie Riley denies nausea, vomiting or muscle weakness.  At risk for osteopenia and osteoporosis Jamie Riley is at higher risk of osteopenia and osteoporosis due to vitamin D deficiency.   ASSESSMENT AND PLAN:  Type 2 diabetes mellitus with hyperglycemia, without long-term current use of insulin (HCC) - Plan: metFORMIN (GLUCOPHAGE) 500 MG tablet  Vitamin D deficiency  At risk for osteoporosis  Class 1 obesity with serious comorbidity and body mass index (BMI) of 32.0 to 32.9 in adult, unspecified obesity type  PLAN:  Diabetes II Jamie Riley has been given extensive diabetes education by myself today including ideal fasting and post-prandial blood glucose readings,  individual ideal Hgb A1c goals and hypoglycemia prevention. We discussed the importance of good blood sugar control to decrease the likelihood of diabetic complications such as nephropathy, neuropathy, limb loss, blindness, coronary artery disease, and death. We discussed the importance of intensive lifestyle modification including diet, exercise and weight loss as the first line treatment for diabetes. Jamie Riley agrees to start Metformin 500 mg qAM #30 with no refills and follow up at the agreed upon time. Patient already has a statin. Jamie Riley is to set up an eye exam and a foot exam.  Vitamin D Deficiency Jamie Riley was informed that low vitamin D levels contributes to fatigue and are associated with obesity, breast, and colon cancer. She agrees to continue to take prescription Vit D ,000 IU every week and will follow up for routine testing of vitamin D, at least 2-3 times per year. She was informed of the risk of over-replacement of vitamin D and agrees to not increase her dose unless she discusses this with [place] first. We will follow up her vitamin D level in 3 months.  At risk for osteopenia and osteoporosis Jamie Riley was given extended  (15 minutes) osteoporosis prevention counseling today. Jamie Riley is at risk for osteopenia and osteoporosis due to her vitamin D deficiency. She was encouraged to take her vitamin D and follow her higher calcium diet and increase strengthening exercise to help strengthen her bones and decrease her risk of osteopenia and osteoporosis.  Obesity Jamie Riley is currently in the action stage of change.  As such, her goal is to continue with weight loss efforts She has agreed to follow the Category 3 plan with lunch options Jamie Riley has been instructed to work up to a goal of 150 minutes of combined cardio and strengthening exercise per week for weight loss and overall health benefits. We discussed the following Behavioral Modification Strategies today: better snacking choices, planning for  success, increasing lean protein intake, increasing vegetables and work on meal planning and easy cooking plans  Jamie Riley has agreed to follow up with our clinic in 2 weeks. She was informed of the importance of frequent follow up visits to maximize her success with intensive lifestyle modifications for her multiple health conditions.  ALLERGIES: No Known Allergies  MEDICATIONS: Current Outpatient Medications on File Prior to Visit  Medication Sig Dispense Refill  . ergocalciferol (VITAMIN D2) 1.25 MG (50000 UT) capsule Take 50,000 Units by mouth once a week.     No current facility-administered medications on file prior to visit.     PAST MEDICAL HISTORY: Past Medical History:  Diagnosis Date  . Decreased hearing   . Elevated hemoglobin A1c measurement   . Fatty liver   . Hyperlipidemia   . Leg cramps   . Prediabetes   . Vitamin D deficiency     PAST SURGICAL HISTORY: Past Surgical History:  Procedure Laterality Date  . TUBAL LIGATION      SOCIAL HISTORY: Social History   Tobacco Use  . Smoking status: Former Games developer  . Smokeless tobacco: Never Used  Substance Use Topics  . Alcohol use: No    Alcohol/week: 0.0 standard drinks  . Drug use: Not on file    FAMILY HISTORY: Family History  Problem Relation Age of Onset  . Breast cancer Cousin 48  . Diabetes Mother   . Hypertension Father   . Heart disease Father     ROS: Review of Systems  Constitutional: Positive for weight loss.  Gastrointestinal: Negative for nausea and vomiting.  Musculoskeletal:       Negative for muscle weakness    PHYSICAL EXAM: Blood pressure 111/74, pulse 74, temperature 97.8 F (36.6 C), temperature source Oral, height 5\' 7"  (1.702 m), weight 208 lb (94.3 kg), SpO2 96 %. Body mass index is 32.58 kg/m. Physical Exam Vitals signs reviewed.  Constitutional:      Appearance: Normal appearance. She is well-developed. She is obese.  Cardiovascular:     Rate and Rhythm: Normal rate.   Pulmonary:     Effort: Pulmonary effort is normal.  Musculoskeletal: Normal range of motion.  Skin:    General: Skin is warm and dry.  Neurological:     Mental Status: She is alert and oriented to person, place, and time.  Psychiatric:        Mood and Affect: Mood normal.        Behavior: Behavior normal.     RECENT LABS AND TESTS: BMET No results found for: NA, K, CL, CO2, GLUCOSE, BUN, CREATININE, CALCIUM, GFRNONAA, GFRAA No results found for: HGBA1C Lab Results  Component Value Date   INSULIN 34.5 (H) 02/16/2019   CBC No results found for: WBC, RBC, HGB, HCT, PLT, MCV, MCH, MCHC, RDW, LYMPHSABS, MONOABS, EOSABS, BASOSABS Iron/TIBC/Ferritin/ %Sat No results found for: IRON, TIBC, FERRITIN, IRONPCTSAT Lipid Panel  No results found for: CHOL, TRIG, HDL, CHOLHDL, VLDL, LDLCALC, LDLDIRECT Hepatic Function Panel  No results found for: PROT, ALBUMIN, AST, ALT, ALKPHOS, BILITOT, BILIDIR, IBILI No results found for: TSH    OBESITY BEHAVIORAL  INTERVENTION VISIT  Today's visit was # 2   Starting weight: 213 lbs Starting date: 02/12/2019 Today's weight : 208 lbs Today's date: 02/26/2019 Total lbs lost to date: 5    02/26/2019  Height 5\' 7"  (1.702 m)  Weight 208 lb (94.3 kg)  BMI (Calculated) 32.57  BLOOD PRESSURE - SYSTOLIC 111  BLOOD PRESSURE - DIASTOLIC 74   Body Fat % 41.1 %  Total Body Water (lbs) 71.4 lbs    ASK: We discussed the diagnosis of obesity with Jamie Riley today and Jamie Riley agreed to give [place] permission to discuss obesity behavioral modification therapy today.  ASSESS: Naly has the diagnosis of obesity and her BMI today is 32.57 Jamie Riley is in the action stage of change   ADVISE: Jamie Riley was educated on the multiple health risks of obesity as well as the benefit of weight loss to improve her health. She was advised of the need for long term treatment and the importance of lifestyle modifications to improve her current health and to decrease her risk of future  health problems.  AGREE: Multiple dietary modification options and treatment options were discussed and  Jamie Riley agreed to follow the recommendations documented in the above note.  ARRANGE: Jamie Riley was educated on the importance of frequent visits to treat obesity as outlined per CMS and USPSTF guidelines and agreed to schedule her next follow up appointment today.  I, Nevada Crane, am acting as transcriptionist for Filbert Schilder, MD  I have reviewed the above documentation for accuracy and completeness, and I agree with the above. - Debbra Riding, MD

## 2019-02-27 ENCOUNTER — Encounter (INDEPENDENT_AMBULATORY_CARE_PROVIDER_SITE_OTHER): Payer: Self-pay | Admitting: Family Medicine

## 2019-03-02 ENCOUNTER — Other Ambulatory Visit (INDEPENDENT_AMBULATORY_CARE_PROVIDER_SITE_OTHER): Payer: Self-pay

## 2019-03-02 DIAGNOSIS — E1165 Type 2 diabetes mellitus with hyperglycemia: Secondary | ICD-10-CM

## 2019-03-02 MED ORDER — METFORMIN HCL 500 MG PO TABS: 500.0000 mg | ORAL_TABLET | Freq: Every day | ORAL | 0 refills | Status: AC

## 2019-03-03 ENCOUNTER — Encounter (INDEPENDENT_AMBULATORY_CARE_PROVIDER_SITE_OTHER): Payer: Self-pay

## 2019-03-16 ENCOUNTER — Other Ambulatory Visit: Payer: Self-pay

## 2019-03-16 ENCOUNTER — Encounter (INDEPENDENT_AMBULATORY_CARE_PROVIDER_SITE_OTHER): Payer: Self-pay | Admitting: Family Medicine

## 2019-03-16 ENCOUNTER — Ambulatory Visit (INDEPENDENT_AMBULATORY_CARE_PROVIDER_SITE_OTHER): Payer: 59 | Admitting: Family Medicine

## 2019-03-16 DIAGNOSIS — Z6832 Body mass index (BMI) 32.0-32.9, adult: Secondary | ICD-10-CM | POA: Diagnosis not present

## 2019-03-16 DIAGNOSIS — E1165 Type 2 diabetes mellitus with hyperglycemia: Secondary | ICD-10-CM

## 2019-03-16 DIAGNOSIS — E559 Vitamin D deficiency, unspecified: Secondary | ICD-10-CM | POA: Diagnosis not present

## 2019-03-16 DIAGNOSIS — E669 Obesity, unspecified: Secondary | ICD-10-CM | POA: Diagnosis not present

## 2019-03-16 NOTE — Progress Notes (Signed)
Office: [phone]  /  Fax: [phone] TeleHealth Visit:  Jamie Riley has verbally consented to this TeleHealth visit today. The patient is located at home, the provider is located at the [facility] office. The participants in this visit include the listed provider and patient and provider's assistant. The visit was conducted today via face time.  HPI:   Chief Complaint: OBESITY Jamie Riley is here to discuss her progress with her obesity treatment plan. She is on the Category 3 plan with lunch options and is following her eating plan approximately 98 % of the time. She states she is walking for 30 minutes 4-5 times per week. [name] is finding it difficult to find bread on plan. She has noticed increase in boredom snacking at night. She is walking about 30 minutes 4-5 weeks just to get out of the house.  We were unable to weigh the patient today for this TeleHealth visit. She feels as if she has lost 1-2 lbs since her last visit. She has lost 5-7 lbs since starting treatment with [place].  Diabetes II with Hyperglycemia [name] has a diagnosis of diabetes type II. Wyllow hasn't started metformin yet secondary to concerns of Coronavirus. She denies episodes of hypoglycemia. She has been working on intensive lifestyle modifications including diet, exercise, and weight loss to help control her blood glucose levels.  Vitamin D Deficiency Jamie Riley has a diagnosis of vitamin D deficiency. She is currently taking prescription Vit D. She notes fatigue and denies nausea, vomiting or muscle weakness.  ASSESSMENT AND PLAN:  Type 2 diabetes mellitus with hyperglycemia, without long-term current use of insulin (HCC)  Vitamin D deficiency  Class 1 obesity with serious comorbidity and body mass index (BMI) of 32.0 to 32.9 in adult, unspecified obesity type  PLAN:  Diabetes II with Hyperglycemia Jamie Riley has been given extensive diabetes education by myself today including ideal fasting and  post-prandial blood glucose readings, individual ideal Hgb 1c goals and hypoglycemia prevention. We discussed the importance of good blood sugar control to decrease the likelihood of diabetic complications such as nephropathy, neuropathy, limb loss, blindness, coronary artery disease, and death. We discussed the importance of intensive lifestyle modification including diet, exercise and weight loss as the first line treatment for diabetes. [name] agrees to start metformin in 2 weeks.  Vitamin D Deficiency Jamie Riley was informed that low vitamin D levels contributes to fatigue and are associated with obesity, breast, and colon cancer. Lily agrees to continue taking prescription Vit D @50 ,000 IU every week, no refill needed. She will follow up for routine testing of vitamin D, at least 2-3 times per year. She was informed of the risk of over-replacement of vitamin D and agrees to not increase her dose unless she discusses this with [place] first.  Obesity Jamie Riley is currently in the action stage of change. As such, her goal is to continue with weight loss efforts She has agreed to follow the Category 3 plan Jamie Riley has been instructed to work up to a goal of 150 minutes of combined cardio and strengthening exercise per week for weight loss and overall health benefits. We discussed the following Behavioral Modification Strategies today: increasing lean protein intake, increasing vegetables, work on meal planning and easy cooking plans, and planning for success   Jamie Riley has declined to schedule a follow up. She will call when she is ready to schedule. She was informed of the importance of frequent follow up visits to maximize her success with intensive lifestyle modifications for her  multiple health conditions.  ALLERGIES: No Known Allergies  MEDICATIONS: Current Outpatient Medications on File Prior to Visit  Medication Sig Dispense Refill  . ergocalciferol (VITAMIN D2) 1.25 MG (50000 UT) capsule Take 50,000  Units by mouth once a week.    . metFORMIN (GLUCOPHAGE) 500 MG tablet Take 1 tablet (500 mg total) by mouth daily with breakfast. 30 tablet 0   No current facility-administered medications on file prior to visit.     PAST MEDICAL HISTORY: Past Medical History:  Diagnosis Date  . Decreased hearing   . Elevated hemoglobin A1c measurement   . Fatty liver   . Hyperlipidemia   . Leg cramps   . Prediabetes   . Vitamin D deficiency     PAST SURGICAL HISTORY: Past Surgical History:  Procedure Laterality Date  . TUBAL LIGATION      SOCIAL HISTORY: Social History   Tobacco Use  . Smoking status: Former Games developer  . Smokeless tobacco: Never Used  Substance Use Topics  . Alcohol use: No    Alcohol/week: 0.0 standard drinks  . Drug use: Not on file    FAMILY HISTORY: Family History  Problem Relation Age of Onset  . Breast cancer Cousin 48  . Diabetes Mother   . Hypertension Father   . Heart disease Father     ROS: Review of Systems  Constitutional: Positive for malaise/fatigue and weight loss.  Gastrointestinal: Negative for nausea and vomiting.  Musculoskeletal:       Negative muscle weakness  Endo/Heme/Allergies:       Negative hypoglycemia    PHYSICAL EXAM: Pt in no acute distress  RECENT LABS AND TESTS: BMET No results found for: NA, K, CL, CO2, GLUCOSE, BUN, CREATININE, CALCIUM, GFRNONAA, GFRAA No results found for: HGBA1C Lab Results  Component Value Date   INSULIN 34.5 (H) 02/16/2019   CBC No results found for: WBC, RBC, HGB, HCT, PLT, MCV, MCH, MCHC, RDW, LYMPHSABS, MONOABS, EOSABS, BASOSABS Iron/TIBC/Ferritin/ %Sat No results found for: IRON, TIBC, FERRITIN, IRONPCTSAT Lipid Panel  No results found for: CHOL, TRIG, HDL, CHOLHDL, VLDL, LDLCALC, LDLDIRECT Hepatic Function Panel  No results found for: PROT, ALBUMIN, AST, ALT, ALKPHOS, BILITOT, BILIDIR, IBILI No results found for: TSH    I, Burt Knack, am acting as transcriptionist for Debbra Riding, MD  I have reviewed the above documentation for accuracy and completeness, and I agree with the above. - Debbra Riding, MD

## 2019-05-03 ENCOUNTER — Other Ambulatory Visit (INDEPENDENT_AMBULATORY_CARE_PROVIDER_SITE_OTHER): Payer: Self-pay | Admitting: Family Medicine

## 2019-05-03 DIAGNOSIS — E1165 Type 2 diabetes mellitus with hyperglycemia: Secondary | ICD-10-CM

## 2019-09-10 ENCOUNTER — Other Ambulatory Visit: Payer: Self-pay

## 2019-09-10 ENCOUNTER — Encounter (INDEPENDENT_AMBULATORY_CARE_PROVIDER_SITE_OTHER): Payer: Self-pay | Admitting: Family Medicine

## 2019-09-10 ENCOUNTER — Ambulatory Visit (INDEPENDENT_AMBULATORY_CARE_PROVIDER_SITE_OTHER): Payer: 59 | Admitting: Family Medicine

## 2019-09-10 VITALS — BP 124/70 | HR 79 | Temp 97.9°F | Ht 67.0 in | Wt 182.0 lb

## 2019-09-10 DIAGNOSIS — E669 Obesity, unspecified: Secondary | ICD-10-CM | POA: Diagnosis not present

## 2019-09-10 DIAGNOSIS — E1165 Type 2 diabetes mellitus with hyperglycemia: Secondary | ICD-10-CM | POA: Diagnosis not present

## 2019-09-10 DIAGNOSIS — Z9189 Other specified personal risk factors, not elsewhere classified: Secondary | ICD-10-CM | POA: Diagnosis not present

## 2019-09-10 DIAGNOSIS — E559 Vitamin D deficiency, unspecified: Secondary | ICD-10-CM

## 2019-09-10 DIAGNOSIS — Z6830 Body mass index (BMI) 30.0-30.9, adult: Secondary | ICD-10-CM

## 2019-09-10 MED ORDER — ERGOCALCIFEROL 1.25 MG (50000 UT) PO CAPS: 50000.0000 [IU] | ORAL_CAPSULE | ORAL | 0 refills | Status: AC

## 2019-09-14 NOTE — Progress Notes (Signed)
Office: [phone]  /  Fax: [phone]   HPI:   Chief Complaint: OBESITY Jamie Riley is here to discuss her progress with her obesity treatment plan. She is on the Category 3 plan and is following her eating plan approximately 80% of the time. She states she is walking 30 minutes 3 times per week, kayaking 30 minutes 3 times per week, and bike riding 45 minutes 3 times per week. Jamie Riley is working from home. This is her first visit since 03/16/2019. She has tried hard to stick to the plan while working from home. She reports craving crunchy food at night (mostly nuts and dried corn). She is working on consistently getting in 8 ounces of meat at night. Her weight is 182 lb (82.6 kg) today and has had a weight loss of 26 pounds over a period of 6 months since her last in-office visit. She has lost 31 lbs since starting treatment with [place].  Vitamin D deficiency Jamie Riley has a diagnosis of Vitamin D deficiency. She is currently taking prescription Vit D and denies nausea, vomiting or muscle weakness but does report fatigue.  At risk for osteopenia and osteoporosis Jamie Riley is at higher risk of osteopenia and osteoporosis due to Vitamin D deficiency.   Diabetes II with Hyperglycemia, not on Insulin Jamie Riley has a diagnosis of diabetes type II. Jamie Riley does not report checking her blood sugars. She has not gotten her flu shot yet and is unsure when her last eye exam was. She has been working on intensive lifestyle modifications including diet, exercise, and weight loss to help control her blood glucose levels.  ASSESSMENT AND PLAN:  Vitamin D deficiency - Plan: VITAMIN D 25 Hydroxy (Vit-D Deficiency, Fractures), ergocalciferol (VITAMIN D2) 1.25 MG (50000 UT) capsule  At risk for osteoporosis  Type 2 diabetes mellitus with hyperglycemia, without long-term current use of insulin (HCC)  Class 1 obesity with serious comorbidity and body mass index (BMI) of 30.0 to 30.9 in adult, unspecified obesity type -  Starting BMI greater then 30  PLAN:  Vitamin D Deficiency Jamie Riley was informed that low Vitamin D levels contributes to fatigue and are associated with obesity, breast, and colon cancer. She agrees to continue to take prescription Vit D @ 50,000 IU every week #4 with 0 refills and will have routine testing of Vitamin D at [facility]. She was informed of the risk of over-replacement of Vitamin D and agrees to not increase her dose unless she discusses this with [place] first. Jamie Riley agrees to follow-up with our clinic in 2 weeks.  At risk for osteopenia and osteoporosis Jamie Riley was given extended  (15 minutes) osteoporosis prevention counseling today. Jamie Riley is at risk for osteopenia and osteoporosis due to her Vitamin D deficiency. She was encouraged to take her Vitamin D and follow her higher calcium diet and increase strengthening exercise to help strengthen her bones and decrease her risk of osteopenia and osteoporosis.  Diabetes II with Hyperglycemia, not on Insulin [name] has been given extensive diabetes education by myself today including ideal fasting and post-prandial blood glucose readings, individual ideal HgA1c goals  and hypoglycemia prevention. We discussed the importance of good blood sugar control to decrease the likelihood of diabetic complications such as nephropathy, neuropathy, limb loss, blindness, coronary artery disease, and death. We discussed the importance of intensive lifestyle modification including diet, exercise and weight loss as the first line treatment for diabetes. Jamie Riley's labs are pending. She will bring them in at her next appointment.  Obesity Jamie Riley is currently  in the action stage of change. As such, her goal is to continue with weight loss efforts. She has agreed to follow the Category 3 plan. Jamie LaundrySonya has been instructed to work up to a goal of 150 minutes of combined cardio and strengthening exercise per week for weight loss and overall health benefits. We discussed the  following Behavioral Modification Strategies today: increasing lean protein intake, increasing vegetables, work on meal planning and easy cooking plans, keeping healthy foods in the home, and planning for success.  Jamie LaundrySonya has agreed to follow-up with our clinic in 2 weeks. She was informed of the importance of frequent follow-up visits to maximize her success with intensive lifestyle modifications for her multiple health conditions.  ALLERGIES: No Known Allergies  MEDICATIONS: Current Outpatient Medications on File Prior to Visit  Medication Sig Dispense Refill  . metFORMIN (GLUCOPHAGE) 500 MG tablet Take 1 tablet (500 mg total) by mouth daily with breakfast. 30 tablet 0   No current facility-administered medications on file prior to visit.     PAST MEDICAL HISTORY: Past Medical History:  Diagnosis Date  . Decreased hearing   . Elevated hemoglobin A1c measurement   . Fatty liver   . Hyperlipidemia   . Leg cramps   . Prediabetes   . Vitamin D deficiency     PAST SURGICAL HISTORY: Past Surgical History:  Procedure Laterality Date  . TUBAL LIGATION      SOCIAL HISTORY: Social History   Tobacco Use  . Smoking status: Former Games developermoker  . Smokeless tobacco: Never Used  Substance Use Topics  . Alcohol use: No    Alcohol/week: 0.0 standard drinks  . Drug use: Not on file    FAMILY HISTORY: Family History  Problem Relation Age of Onset  . Breast cancer Cousin 48  . Diabetes Mother   . Hypertension Father   . Heart disease Father    ROS: Review of Systems  Constitutional: Positive for malaise/fatigue.  Gastrointestinal: Negative for nausea and vomiting.  Musculoskeletal:       Negative for muscle weakness.   PHYSICAL EXAM: Blood pressure 124/70, pulse 79, temperature 97.9 F (36.6 C), temperature source Oral, height 5\' 7"  (1.702 m), weight 182 lb (82.6 kg), SpO2 98 %. Body mass index is 28.51 kg/m. Physical Exam Vitals signs reviewed.  Constitutional:       Appearance: Normal appearance. She is obese.  Cardiovascular:     Rate and Rhythm: Normal rate.     Pulses: Normal pulses.  Pulmonary:     Effort: Pulmonary effort is normal.     Breath sounds: Normal breath sounds.  Musculoskeletal: Normal range of motion.  Skin:    General: Skin is warm and dry.  Neurological:     Mental Status: She is alert and oriented to person, place, and time.  Psychiatric:        Behavior: Behavior normal.   RECENT LABS AND TESTS: BMET No results found for: NA, K, CL, CO2, GLUCOSE, BUN, CREATININE, CALCIUM, GFRNONAA, GFRAA No results found for: HGBA1C Lab Results  Component Value Date   INSULIN 34.5 (H) 02/16/2019   CBC No results found for: WBC, RBC, HGB, HCT, PLT, MCV, MCH, MCHC, RDW, LYMPHSABS, MONOABS, EOSABS, BASOSABS Iron/TIBC/Ferritin/ %Sat No results found for: IRON, TIBC, FERRITIN, IRONPCTSAT Lipid Panel  No results found for: CHOL, TRIG, HDL, CHOLHDL, VLDL, LDLCALC, LDLDIRECT Hepatic Function Panel  No results found for: PROT, ALBUMIN, AST, ALT, ALKPHOS, BILITOT, BILIDIR, IBILI No results found for: TSH   No results  found for: Vitamin D, 25-Hydroxy  OBESITY BEHAVIORAL INTERVENTION VISIT  Today's visit was #4  Starting weight: 213 lbs Starting date: 02/12/2019 Today's weight: 182 lbs  Today's date: 09/10/2019 Total lbs lost to date: 31    09/10/2019  Height 5\' 7"  (1.702 m)  Weight 182 lb (82.6 kg)  BMI (Calculated) 28.5  BLOOD PRESSURE - SYSTOLIC 557  BLOOD PRESSURE - DIASTOLIC 70   Body Fat % 32.2 %  Total Body Water (lbs) 66 lbs   ASK: We discussed the diagnosis of obesity with Gavin Pound today and Avory agreed to give [place] permission to discuss obesity behavioral modification therapy today.  ASSESS: Cheridan has the diagnosis of obesity and her BMI today is 28.6. Charlei is in the action stage of change.   ADVISE: Larenda was educated on the multiple health risks of obesity as well as the benefit of weight loss to improve her  health. She was advised of the need for long term treatment and the importance of lifestyle modifications to improve her current health and to decrease her risk of future health problems.  AGREE: Multiple dietary modification options and treatment options were discussed and  Shaneisha agreed to follow the recommendations documented in the above note.  ARRANGE: Buford was educated on the importance of frequent visits to treat obesity as outlined per CMS and USPSTF guidelines and agreed to schedule her next follow up appointment today.  I, Michaelene Song, am acting as transcriptionist for Ilene Qua, MD  I have reviewed the above documentation for accuracy and completeness, and I agree with the above. - Ilene Qua, MD

## 2019-09-29 ENCOUNTER — Ambulatory Visit (INDEPENDENT_AMBULATORY_CARE_PROVIDER_SITE_OTHER): Payer: 59 | Admitting: Bariatrics

## 2020-02-04 ENCOUNTER — Other Ambulatory Visit: Payer: Self-pay | Admitting: Physician Assistant

## 2020-02-04 DIAGNOSIS — Z1231 Encounter for screening mammogram for malignant neoplasm of breast: Secondary | ICD-10-CM

## 2020-02-23 ENCOUNTER — Ambulatory Visit
Admission: RE | Admit: 2020-02-23 | Discharge: 2020-02-23 | Disposition: A | Discharge status: Home / Self Care | Payer: Managed Care, Other (non HMO) | Source: Ambulatory Visit | Attending: Physician Assistant | Admitting: Physician Assistant

## 2020-02-23 DIAGNOSIS — Z1231 Encounter for screening mammogram for malignant neoplasm of breast: Secondary | ICD-10-CM | POA: Diagnosis not present

## 2020-06-09 ENCOUNTER — Other Ambulatory Visit: Payer: Self-pay | Admitting: Pediatrics

## 2020-06-10 LAB — SURGICAL PATHOLOGY

## 2022-07-18 ENCOUNTER — Encounter (INDEPENDENT_AMBULATORY_CARE_PROVIDER_SITE_OTHER): Payer: Self-pay

## 2022-09-10 ENCOUNTER — Other Ambulatory Visit: Payer: Self-pay | Admitting: Physician Assistant

## 2022-09-10 DIAGNOSIS — Z1231 Encounter for screening mammogram for malignant neoplasm of breast: Secondary | ICD-10-CM

## 2022-09-18 ENCOUNTER — Ambulatory Visit
Admission: RE | Admit: 2022-09-18 | Discharge: 2022-09-18 | Disposition: A | Discharge status: Home / Self Care | Payer: Managed Care, Other (non HMO) | Source: Ambulatory Visit | Attending: Physician Assistant | Admitting: Physician Assistant

## 2022-09-18 DIAGNOSIS — Z1231 Encounter for screening mammogram for malignant neoplasm of breast: Secondary | ICD-10-CM | POA: Insufficient documentation

## 2023-09-12 ENCOUNTER — Other Ambulatory Visit: Payer: Self-pay | Admitting: Physician Assistant

## 2023-09-12 DIAGNOSIS — Z1231 Encounter for screening mammogram for malignant neoplasm of breast: Secondary | ICD-10-CM

## 2023-10-24 ENCOUNTER — Ambulatory Visit
Admission: RE | Admit: 2023-10-24 | Discharge: 2023-10-24 | Disposition: A | Discharge status: Home / Self Care | Payer: Managed Care, Other (non HMO) | Source: Ambulatory Visit | Attending: Physician Assistant | Admitting: Physician Assistant

## 2023-10-24 DIAGNOSIS — Z1231 Encounter for screening mammogram for malignant neoplasm of breast: Secondary | ICD-10-CM | POA: Diagnosis present

## 2024-07-08 ENCOUNTER — Other Ambulatory Visit: Payer: Self-pay | Admitting: Physician Assistant

## 2024-07-08 DIAGNOSIS — Z1231 Encounter for screening mammogram for malignant neoplasm of breast: Secondary | ICD-10-CM

## 2024-10-26 ENCOUNTER — Ambulatory Visit
Admission: RE | Admit: 2024-10-26 | Discharge: 2024-10-26 | Disposition: A | Discharge status: Home / Self Care | Source: Ambulatory Visit | Attending: Physician Assistant | Admitting: Physician Assistant

## 2024-10-26 DIAGNOSIS — Z1231 Encounter for screening mammogram for malignant neoplasm of breast: Secondary | ICD-10-CM | POA: Insufficient documentation

## 2024-11-18 ENCOUNTER — Ambulatory Visit: Admitting: Urology

## 2024-11-18 NOTE — Progress Notes (Deleted)
° °  11/18/24 7:56 AM   Jamie Riley [birth year] [id number]   HPI: 58 y.o. female here for initial evaluation of ***    PMH: Past Medical History:  Diagnosis Date   Decreased hearing    Elevated hemoglobin A1c measurement    Fatty liver    Hyperlipidemia    Leg cramps    Prediabetes    Vitamin D  deficiency     Surgical History: Past Surgical History:  Procedure Laterality Date   TUBAL LIGATION      Family History: Family History  Problem Relation Age of Onset   Breast cancer Cousin 60   Diabetes Mother    Hypertension Father    Heart disease Father     Social History:  reports that she has quit smoking. She has never used smokeless tobacco. She reports that she does not drink alcohol. No history on file for drug use.      Physical Exam: There were no vitals taken for this visit.   Constitutional:  Alert and oriented, No acute distress. Cardiovascular: No clubbing, cyanosis, or edema. Respiratory: Normal respiratory effort, no increased work of breathing. GI: Nondistended Skin: No rashes, bruises or suspicious lesions. Neurologic: Grossly intact, no focal deficits, moving all 4 extremities. Psychiatric: Normal mood and affect.  Laboratory Data: N/A   Pertinent Imaging: N/A    Assessment & Plan:    There are no diagnoses linked to this encounter.    Penne Skye, MD 11/18/2024  [facility] [address], [address] [place], [place] [address] [phone]
